# Patient Record
Sex: Female | Born: 1950 | Race: White | Hispanic: No | Marital: Married | Smoking: Never smoker
Health system: Southern US, Community
[De-identification: ages and names within clinical notes are randomized; demographics above are authoritative.]

## PROBLEM LIST (undated history)

## (undated) DIAGNOSIS — C50919 Malignant neoplasm of unspecified site of unspecified female breast: Secondary | ICD-10-CM

## (undated) DIAGNOSIS — F32A Depression, unspecified: Secondary | ICD-10-CM

## (undated) HISTORY — PX: MASTECTOMY: SHX3

---

## 2004-03-24 ENCOUNTER — Ambulatory Visit (HOSPITAL_COMMUNITY): Admission: RE | Admit: 2004-03-24 | Discharge: 2004-03-24 | Payer: Self-pay | Admitting: Gastroenterology

## 2004-03-24 ENCOUNTER — Encounter (INDEPENDENT_AMBULATORY_CARE_PROVIDER_SITE_OTHER): Payer: Self-pay | Admitting: Specialist

## 2009-08-03 ENCOUNTER — Encounter: Admission: RE | Admit: 2009-08-03 | Discharge: 2009-08-03 | Payer: Self-pay | Admitting: Orthopedic Surgery

## 2009-08-27 ENCOUNTER — Encounter: Admission: RE | Admit: 2009-08-27 | Discharge: 2009-08-27 | Payer: Self-pay | Admitting: Neurosurgery

## 2010-09-07 IMAGING — CT CT EXTREM UP W/O CM*R*
2 of 3 series · 8 of 14 positions shown, 9 images · non-contrast
Comparison: None

CLINICAL DATA: Humeral fracture.  History of right breast cancer.

CT OF THE RIGHT SHOULDER WITHOUT CONTRAST
TECHNIQUE: Multidetector CT imaging of the right shoulder was
performed according to the standard protocol without intravenous
contrast. Multiplanar CT image reconstructions were also generated.

[Series 3: shoulder/standard · axial · 0.27mm/px · z∈[-175,-50]mm · 6 of 142 slices shown]
[im 21/142  soft-tissue]
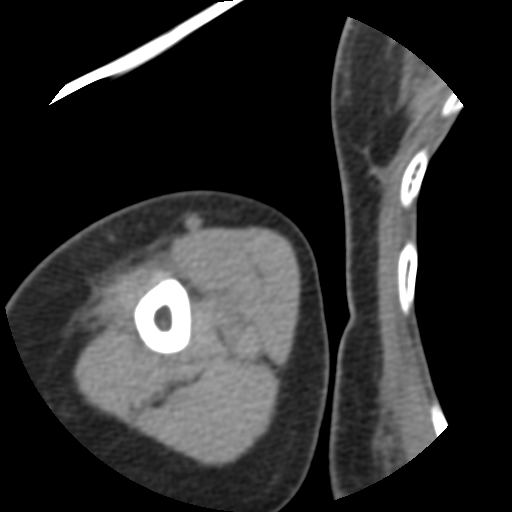
[im 41/142  soft-tissue]
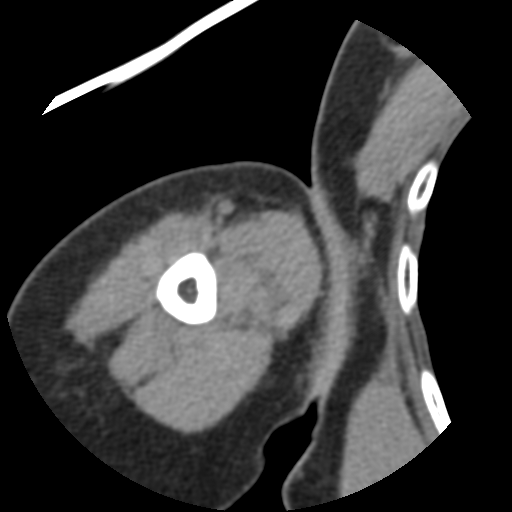
[im 61/142  soft-tissue]
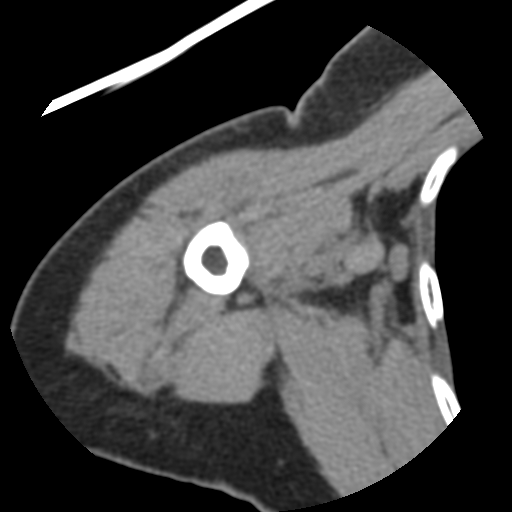
[im 81/142  soft-tissue]
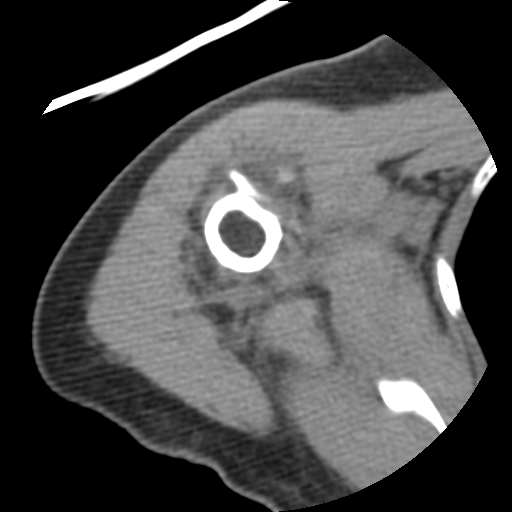
[im 101/142  soft-tissue]
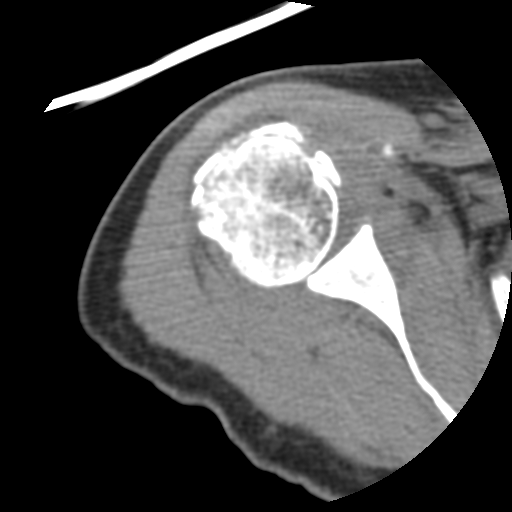
[im 121/142  soft-tissue]
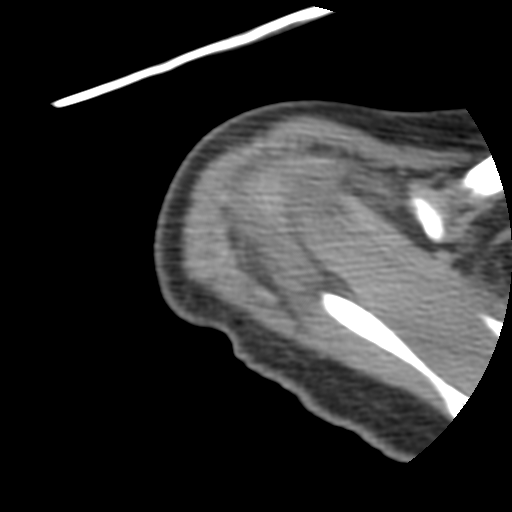

[Series 401: reformat · oblique · 0.35mm/px · 2 of 65 slices shown, 3 images]
[im 22/65  soft-tissue]
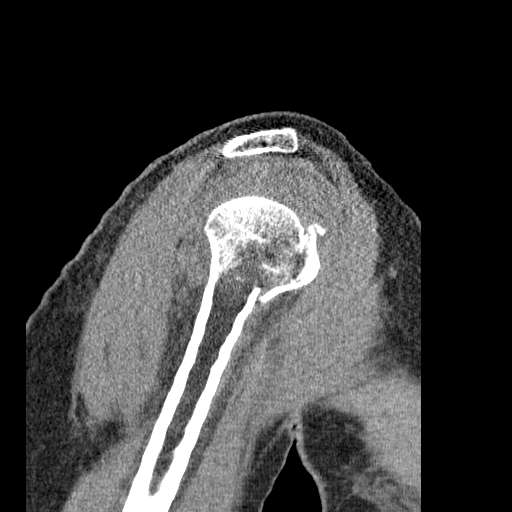
[im 22/65  bone]
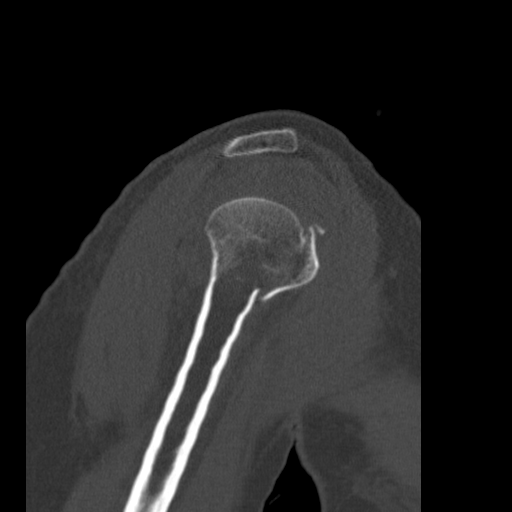
[im 43/65  bone]
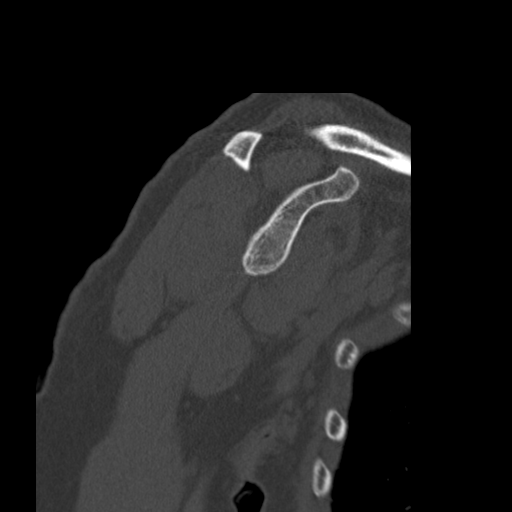

[8 of 14 positions shown; findings below may reference images not displayed]

FINDINGS: There is a mildly comminuted fracture of the surgical
neck of the right humerus.  This involves both tuberosities which
are mildly displaced.  The majority of the articular surface of the
humeral head is spared.  The main fracture fragments demonstrate
mild displacement.  There is no significant angulation.

There is moderate sized shoulder joint effusion.  The scapular
glenoid, acromion and coracoid process are intact.  The scapular
body is incompletely imaged.  On the lower axial images, there is
lucency in the scapular body (images 62-70) which could reflect a
nondisplaced fracture or vascular groove.  No rib fracture is
identified.

Normal-sized lymph nodes are present in the right axilla.  No lytic
or blastic osseous lesions are identified.
IMPRESSION: 1.  Comminuted and mildly displaced fracture of the humeral neck
involving both tuberosities.
2.  Possible nondisplaced fracture of the scapular body.  There is
no involvement of the glenoid or acromion.
3.  No evidence of osseous metastatic disease.

## 2011-05-06 NOTE — Op Note (Signed)
NAME:  Crystal Alvarado, Crystal Alvarado                        ACCOUNT NO.:  [id number]   MEDICAL RECORD NO.:  [id number]                   PATIENT TYPE:  AMB   LOCATION:  ENDO                                 FACILITY:  [facility]   PHYSICIAN:  Danise Edge, M.D.                DATE OF BIRTH:  04-14-1951   DATE OF PROCEDURE:  03/24/2004  DATE OF DISCHARGE:                                 OPERATIVE REPORT   PROCEDURE:  Colonoscopy and polypectomy.   REFERRED BY:  Windle Guard, M.D.   INDICATIONS FOR PROCEDURE:  Crystal Alvarado is a 60 year old female born  11-01-1951.  Crystal Alvarado is scheduled to undergo her first screening  colonoscopy with polypectomy.  She has guaiac positive stool.  Her  grandmother, two uncles, and her aunt were diagnosed with colon cancer.  Her  mother died at an early age and was not diagnosed with colon cancer.  Her  older brother has not undergone a screening colonoscopy.   ENDOSCOPIST:  Danise Edge, M.D.   PREMEDICATION:  Versed 10 mg, Demerol 50 mg.   DESCRIPTION OF PROCEDURE:  After obtaining informed consent, Crystal Alvarado was  placed in the left lateral decubitus position. I administered intravenous  Demerol and intravenous Versed to achieve conscious sedation for the  procedure. The patient's blood pressure, oxygen saturation and cardiac  rhythm were monitored throughout the procedure and documented in the medical  record.   Anal inspection and digital rectal examination were normal. The Olympus  adjustable pediatric video colonoscope was introduced into the rectum and  advanced to the cecum. Colonic preparation for the exam today was excellent.   RECTUM:  Normal.   SIGMOID COLON AND DESCENDING COLON:  From the distal sigmoid colon at 35 cm  from the anal verge, a 1 cm pedunculated polyp and a 2 mm sessile polyp were  removed with the electrocautery snare and submitted in one bottle for  pathological evaluation.   SPLENIC FLEXURE:  Normal.   TRANSVERSE COLON:  Normal.   HEPATIC FLEXURE:  Normal.   ASCENDING COLON:  Normal.   CECUM AND ILEOCECAL VALVE:  Normal.   ASSESSMENT:  From the distal sigmoid colon, a 1 cm pedunculated polyp and a  2 mm sessile polyp were removed.   RECOMMENDATIONS:  Repeat colonoscopy in 3-5 years.                                               Danise Edge, M.D.    MJ/MEDQ  D:  03/24/2004  T:  03/24/2004  Job:  [id number]   cc:   Windle Guard, M.D.  [address]  [place], [place] [address]  Fax: [phone]

## 2014-04-23 ENCOUNTER — Other Ambulatory Visit: Payer: Self-pay | Admitting: Family Medicine

## 2014-04-23 DIAGNOSIS — E559 Vitamin D deficiency, unspecified: Secondary | ICD-10-CM

## 2014-05-01 ENCOUNTER — Ambulatory Visit
Admission: RE | Admit: 2014-05-01 | Discharge: 2014-05-01 | Disposition: A | Discharge status: Home / Self Care | Payer: BC Managed Care – PPO | Source: Ambulatory Visit | Attending: Family Medicine | Admitting: Family Medicine

## 2014-05-01 DIAGNOSIS — E559 Vitamin D deficiency, unspecified: Secondary | ICD-10-CM

## 2020-01-30 ENCOUNTER — Ambulatory Visit: Payer: Self-pay

## 2024-09-08 ENCOUNTER — Encounter (HOSPITAL_COMMUNITY): Payer: Self-pay

## 2024-09-08 ENCOUNTER — Emergency Department (HOSPITAL_COMMUNITY)

## 2024-09-08 ENCOUNTER — Observation Stay (HOSPITAL_COMMUNITY)
Admission: EM | Admit: 2024-09-08 | Discharge: 2024-09-10 | Disposition: A | Discharge status: Home / Self Care | Attending: Internal Medicine | Admitting: Internal Medicine

## 2024-09-08 ENCOUNTER — Other Ambulatory Visit: Payer: Self-pay

## 2024-09-08 DIAGNOSIS — R42 Dizziness and giddiness: Principal | ICD-10-CM

## 2024-09-08 DIAGNOSIS — I517 Cardiomegaly: Secondary | ICD-10-CM | POA: Insufficient documentation

## 2024-09-08 DIAGNOSIS — I6389 Other cerebral infarction: Secondary | ICD-10-CM | POA: Diagnosis not present

## 2024-09-08 DIAGNOSIS — M810 Age-related osteoporosis without current pathological fracture: Secondary | ICD-10-CM | POA: Insufficient documentation

## 2024-09-08 DIAGNOSIS — F32A Depression, unspecified: Secondary | ICD-10-CM | POA: Diagnosis not present

## 2024-09-08 DIAGNOSIS — R112 Nausea with vomiting, unspecified: Secondary | ICD-10-CM | POA: Diagnosis present

## 2024-09-08 DIAGNOSIS — Z853 Personal history of malignant neoplasm of breast: Secondary | ICD-10-CM | POA: Diagnosis not present

## 2024-09-08 DIAGNOSIS — R2681 Unsteadiness on feet: Secondary | ICD-10-CM | POA: Diagnosis not present

## 2024-09-08 DIAGNOSIS — I2699 Other pulmonary embolism without acute cor pulmonale: Secondary | ICD-10-CM | POA: Diagnosis not present

## 2024-09-08 DIAGNOSIS — I63442 Cerebral infarction due to embolism of left cerebellar artery: Secondary | ICD-10-CM

## 2024-09-08 HISTORY — DX: Malignant neoplasm of unspecified site of unspecified female breast: C50.919

## 2024-09-08 HISTORY — DX: Depression, unspecified: F32.A

## 2024-09-08 LAB — CBC WITH DIFFERENTIAL/PLATELET
Abs Immature Granulocytes: 0.02 K/uL (ref 0.00–0.07)
Basophils Absolute: 0 K/uL (ref 0.0–0.1)
Basophils Relative: 1 %
Eosinophils Absolute: 0.1 K/uL (ref 0.0–0.5)
Eosinophils Relative: 1 %
HCT: 44.6 % (ref 36.0–46.0)
Hemoglobin: 14.4 g/dL (ref 12.0–15.0)
Immature Granulocytes: 0 %
Lymphocytes Relative: 23 %
Lymphs Abs: 1.4 K/uL (ref 0.7–4.0)
MCH: 30.3 pg (ref 26.0–34.0)
MCHC: 32.3 g/dL (ref 30.0–36.0)
MCV: 93.9 fL (ref 80.0–100.0)
Monocytes Absolute: 0.6 K/uL (ref 0.1–1.0)
Monocytes Relative: 10 %
Neutro Abs: 4 K/uL (ref 1.7–7.7)
Neutrophils Relative %: 65 %
Platelets: 236 K/uL (ref 150–400)
RBC: 4.75 MIL/uL (ref 3.87–5.11)
RDW: 12.7 % (ref 11.5–15.5)
WBC: 6.1 K/uL (ref 4.0–10.5)
nRBC: 0 % (ref 0.0–0.2)

## 2024-09-08 LAB — BASIC METABOLIC PANEL WITH GFR
Anion gap: 13 (ref 5–15)
BUN: 23 mg/dL (ref 8–23)
CO2: 21 mmol/L — ABNORMAL LOW (ref 22–32)
Calcium: 9 mg/dL (ref 8.9–10.3)
Chloride: 107 mmol/L (ref 98–111)
Creatinine, Ser: 0.69 mg/dL (ref 0.44–1.00)
GFR, Estimated: >60 mL/min (ref >60)
Glucose, Bld: 100 mg/dL — ABNORMAL HIGH (ref 70–99)
Potassium: 4.3 mmol/L (ref 3.5–5.1)
Sodium: 140 mmol/L (ref 135–145)

## 2024-09-08 LAB — APTT: aPTT: 26 s (ref 24–36)

## 2024-09-08 LAB — TROPONIN T, HIGH SENSITIVITY: Troponin T High Sensitivity: <15 ng/L (ref 0–19)

## 2024-09-08 MED ORDER — HEPARIN (PORCINE) 25000 UT/250ML-% IV SOLN
650.0000 [IU]/h | INTRAVENOUS | Status: DC
  Administered 2024-09-08: 800 [IU]/h via INTRAVENOUS
  Administered 2024-09-10: 650 [IU]/h via INTRAVENOUS
  Filled 2024-09-08 (×2): qty 250

## 2024-09-08 MED ORDER — IOHEXOL 350 MG/ML SOLN
75.0000 mL | Freq: Once | INTRAVENOUS | Status: AC | PRN
  Administered 2024-09-08: 75 mL via INTRAVENOUS

## 2024-09-08 MED ORDER — HEPARIN BOLUS VIA INFUSION
2500.0000 [IU] | Freq: Once | INTRAVENOUS | Status: AC
  Administered 2024-09-08: 2500 [IU] via INTRAVENOUS
  Filled 2024-09-08: qty 2500

## 2024-09-08 MED ORDER — MECLIZINE HCL 25 MG PO TABS
25.0000 mg | ORAL_TABLET | Freq: Once | ORAL | Status: AC
  Administered 2024-09-08: 25 mg via ORAL
  Filled 2024-09-08: qty 1
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
## 2024-09-08 NOTE — H&P (Signed)
  History and Physical    Patient: Crystal Alvarado FMW:[id number] DOB: 05/15/1951 DOA: 09/08/2024 DOS: the patient was seen and examined on 09/08/2024 PCP: Loring Tanda Mae, MD  Patient coming from: Home  Chief Complaint:  Chief Complaint  Patient presents with   Dizziness   HPI: Crystal Alvarado is a 73 y.o. female with a history of breast CA who presented to the ED for dizziness, unsteadiness on her feet and on the advice of medical professional due to concern for PE on recent work up.   She reports***   Review of Systems: As mentioned in the history of present illness. All other systems reviewed and are negative. Past Medical History:  Diagnosis Date   Breast cancer [facility])    Depression    Past Surgical History:  Procedure Laterality Date   CESAREAN SECTION     MASTECTOMY     Social History:  reports that she has never smoked. She has never used smokeless tobacco. No history on file for alcohol use and drug use.  No Known Allergies  History reviewed. No pertinent family history.  Prior to Admission medications   Not on File    Physical Exam: Vitals:   09/08/24 2014 09/08/24 2015  BP: (!) 166/92   Pulse: 85   Resp: 18   Temp: 98.3 F (36.8 C)   TempSrc: Oral   SpO2: 100%   Weight:  51.7 kg  Height:  4' 11 (1.499 m)  Gen: *** Pulm: ***  CV: *** GI: Soft, NT, ND, +BS *** Neuro: Alert and oriented***. No new focal deficits. Ext: Warm, no deformities*** Skin: ***. ***o rashes, lesions or ulcers on visualized skin   Data Reviewed: {Results:26384}  Assessment and Plan: Acute/subacute segmental PE: Provoked by recent travel, also +FH w/mother. No RV strain by CTA. With her age, sex, breast CA history, falls into intermediate PESI (score 102).  - Will continue heparin  IV per pharmacy and likely convert to DOAC in AM.  - Echocardiogram - LE venous U/S***  Vertigo/dizziness:  - MRI brain in AM*** - Continue telemetry until central cause/CVA ruled out.   - For tonight will allow for permissive HTN.  - Vestibular PT in AM***    Advance Care Planning: Full code  Consults: None  Family Communication: ***  Severity of Illness: The appropriate patient status for this patient is OBSERVATION. Observation status is judged to be reasonable and necessary in order to provide the required intensity of service to ensure the patient's safety. The patient's presenting symptoms, physical exam findings, and initial radiographic and laboratory data in the context of their medical condition is felt to place them at decreased risk for further clinical deterioration. Furthermore, it is anticipated that the patient will be medically stable for discharge from the hospital within 2 midnights of admission.   Author: Bernardino KATHEE Come, MD 09/08/2024 11:14 PM  For on call review [email or web address].

## 2024-09-08 NOTE — ED Triage Notes (Signed)
 Pt presents via POV c/o dizziness. Reports was on a cruise and was sent to the ED for the same and was ruled out for brain bleed and stroke per pt report.   Pt report was called today and told to come to ED due to blood clots in top of lungs.   Denies chest pain and SOB.

## 2024-09-08 NOTE — ED Notes (Signed)
 Patient transported to CT

## 2024-09-08 NOTE — Progress Notes (Signed)
 PHARMACY - ANTICOAGULATION CONSULT NOTE  Pharmacy Consult for heparin  Indication: pulmonary embolus  No Known Allergies  Patient Measurements: Height: 4' 11 (149.9 cm) Weight: 51.7 kg (114 lb) IBW/kg (Calculated) : 43.2 HEPARIN  DW (KG): 51.7  Vital Signs: Temp: 98.3 F (36.8 C) (09/21 2014) Temp Source: Oral (09/21 2014) BP: 166/92 (09/21 2014) Pulse Rate: 85 (09/21 2014)  Labs: Recent Labs    09/08/24 2104  HGB 14.4  HCT 44.6  PLT 236  CREATININE 0.69    Estimated Creatinine Clearance: 43.4 mL/min (by C-G formula based on SCr of 0.69 mg/dL).   Medical History: Past Medical History:  Diagnosis Date   Breast cancer [facility])    Depression      Assessment: 73 yo female with PE, pharmacy to dose heparin , no prior AC noted  CBC WNL, Scr 0.69  Goal of Therapy:  Heparin  level 0.3-0.7 units/ml Monitor platelets by anticoagulation protocol: Yes   Plan:  Heparin  bolus 2500 units x 1 Start heparin  drip at 800 units/hr Heparin  level in 8 hours Daily CBC   Leeroy Mace RPh 09/08/2024, 10:49 PM

## 2024-09-09 ENCOUNTER — Observation Stay (HOSPITAL_BASED_OUTPATIENT_CLINIC_OR_DEPARTMENT_OTHER)

## 2024-09-09 ENCOUNTER — Encounter (HOSPITAL_COMMUNITY): Payer: Self-pay | Admitting: Family Medicine

## 2024-09-09 ENCOUNTER — Observation Stay (HOSPITAL_COMMUNITY)

## 2024-09-09 DIAGNOSIS — Z86711 Personal history of pulmonary embolism: Secondary | ICD-10-CM | POA: Diagnosis not present

## 2024-09-09 DIAGNOSIS — I63442 Cerebral infarction due to embolism of left cerebellar artery: Secondary | ICD-10-CM

## 2024-09-09 DIAGNOSIS — I639 Cerebral infarction, unspecified: Secondary | ICD-10-CM

## 2024-09-09 DIAGNOSIS — I2699 Other pulmonary embolism without acute cor pulmonale: Secondary | ICD-10-CM | POA: Diagnosis not present

## 2024-09-09 DIAGNOSIS — R42 Dizziness and giddiness: Principal | ICD-10-CM

## 2024-09-09 DIAGNOSIS — R29701 NIHSS score 1: Secondary | ICD-10-CM

## 2024-09-09 DIAGNOSIS — Z853 Personal history of malignant neoplasm of breast: Secondary | ICD-10-CM

## 2024-09-09 DIAGNOSIS — I2609 Other pulmonary embolism with acute cor pulmonale: Secondary | ICD-10-CM

## 2024-09-09 LAB — BASIC METABOLIC PANEL WITH GFR
Anion gap: 12 (ref 5–15)
BUN: 19 mg/dL (ref 8–23)
CO2: 23 mmol/L (ref 22–32)
Calcium: 8.3 mg/dL — ABNORMAL LOW (ref 8.9–10.3)
Chloride: 106 mmol/L (ref 98–111)
Creatinine, Ser: 0.64 mg/dL (ref 0.44–1.00)
GFR, Estimated: >60 mL/min (ref >60)
Glucose, Bld: 95 mg/dL (ref 70–99)
Potassium: 4 mmol/L (ref 3.5–5.1)
Sodium: 140 mmol/L (ref 135–145)

## 2024-09-09 LAB — ECHOCARDIOGRAM COMPLETE
Area-P 1/2: 3.6 cm2
Height: 59 in
S' Lateral: 2.6 cm
Single Plane A2C EF: 51 %
Weight: 1824 [oz_av]

## 2024-09-09 LAB — LIPID PANEL
Cholesterol: 272 mg/dL — ABNORMAL HIGH (ref 0–200)
HDL: 53 mg/dL (ref >40)
LDL Cholesterol: 197 mg/dL — ABNORMAL HIGH (ref 0–99)
Total CHOL/HDL Ratio: 5.1 ratio
Triglycerides: 109 mg/dL (ref <150)
VLDL: 22 mg/dL (ref 0–40)

## 2024-09-09 LAB — CBC
HCT: 41.7 % (ref 36.0–46.0)
Hemoglobin: 13.8 g/dL (ref 12.0–15.0)
MCH: 31.4 pg (ref 26.0–34.0)
MCHC: 33.1 g/dL (ref 30.0–36.0)
MCV: 94.8 fL (ref 80.0–100.0)
Platelets: 218 K/uL (ref 150–400)
RBC: 4.4 MIL/uL (ref 3.87–5.11)
RDW: 13 % (ref 11.5–15.5)
WBC: 5.9 K/uL (ref 4.0–10.5)
nRBC: 0 % (ref 0.0–0.2)

## 2024-09-09 LAB — HEMOGLOBIN A1C
Hgb A1c MFr Bld: 5 % (ref 4.8–5.6)
Mean Plasma Glucose: 96.8 mg/dL

## 2024-09-09 LAB — HEPARIN LEVEL (UNFRACTIONATED)
Heparin Unfractionated: 0.78 [IU]/mL — ABNORMAL HIGH (ref 0.30–0.70)
Heparin Unfractionated: 0.4 [IU]/mL (ref 0.30–0.70)

## 2024-09-09 MED ORDER — ONDANSETRON HCL 4 MG PO TABS: 4.0000 mg | ORAL_TABLET | Freq: Four times a day (QID) | ORAL | Status: DC | PRN

## 2024-09-09 MED ORDER — SODIUM CHLORIDE 0.9% FLUSH
3.0000 mL | Freq: Two times a day (BID) | INTRAVENOUS | Status: DC
Start: 2024-09-09 — End: 2024-09-10
  Administered 2024-09-09 – 2024-09-10 (×2): 3 mL via INTRAVENOUS

## 2024-09-09 MED ORDER — MECLIZINE HCL 25 MG PO TABS: 25.0000 mg | ORAL_TABLET | Freq: Three times a day (TID) | ORAL | Status: DC | PRN

## 2024-09-09 MED ORDER — IOHEXOL 350 MG/ML SOLN
75.0000 mL | Freq: Once | INTRAVENOUS | Status: AC | PRN
  Administered 2024-09-09: 75 mL via INTRAVENOUS

## 2024-09-09 MED ORDER — ATORVASTATIN CALCIUM 40 MG PO TABS
40.0000 mg | ORAL_TABLET | Freq: Every day | ORAL | Status: DC
  Administered 2024-09-09 – 2024-09-10 (×2): 40 mg via ORAL
  Filled 2024-09-09 (×2): qty 1

## 2024-09-09 MED ORDER — ONDANSETRON HCL 4 MG/2ML IJ SOLN: 4.0000 mg | Freq: Four times a day (QID) | INTRAMUSCULAR | Status: DC | PRN

## 2024-09-09 MED ORDER — ESCITALOPRAM OXALATE 10 MG PO TABS
10.0000 mg | ORAL_TABLET | Freq: Every day | ORAL | Status: DC
  Administered 2024-09-09 – 2024-09-10 (×2): 10 mg via ORAL
  Filled 2024-09-09 (×2): qty 1

## 2024-09-09 MED ORDER — STROKE: EARLY STAGES OF RECOVERY BOOK
Freq: Once | Status: AC
  Filled 2024-09-09: qty 1

## 2024-09-09 MED ORDER — ACETAMINOPHEN 650 MG RE SUPP: 650.0000 mg | Freq: Four times a day (QID) | RECTAL | Status: DC | PRN

## 2024-09-09 MED ORDER — ACETAMINOPHEN 325 MG PO TABS: 650.0000 mg | ORAL_TABLET | Freq: Four times a day (QID) | ORAL | Status: DC | PRN

## 2024-09-09 NOTE — Progress Notes (Signed)
 SLP Cancellation Note  Patient Details Name: Crystal Alvarado MRN: [id number] DOB: 1951/04/24   Cancelled treatment:       Reason Eval/Treat Not Completed: SLP screened, no needs identified, will sign off  Norleen IVAR Blase, MA, CCC-SLP Speech Therapy

## 2024-09-09 NOTE — Progress Notes (Signed)
 PHARMACY - ANTICOAGULATION CONSULT NOTE  Pharmacy Consult for heparin  Indication: pulmonary embolus  No Known Allergies  Patient Measurements: Height: 4' 11 (149.9 cm) Weight: 51.7 kg (114 lb) IBW/kg (Calculated) : 43.2 HEPARIN  DW (KG): 51.7  Vital Signs: Temp: 98.4 F (36.9 C) (09/22 0812) Temp Source: Oral (09/22 0812) BP: 123/79 (09/22 0812) Pulse Rate: 66 (09/22 0812)  Labs: Recent Labs    09/08/24 2104 09/08/24 2305 09/09/24 0705  HGB 14.4  --  13.8  HCT 44.6  --  41.7  PLT 236  --  218  APTT  --  26  --   HEPARINUNFRC  --   --  0.78*  CREATININE 0.69  --  0.64    Estimated Creatinine Clearance: 43.4 mL/min (by C-G formula based on SCr of 0.64 mg/dL).   Medical History: Past Medical History:  Diagnosis Date   Breast cancer [facility])    Depression      Assessment: 73 yo female with history of breast cancer presented to ED for dizziness and per advice of medical professional for workup for PE. Patient reports recent travel (flight + cruise). During cruise, she was evaluated in the ED during stop in [place] where she had a CT head negative for stroke/bleeding per patient. She continued to have symptoms of vertigo. She had messages noting concern for blood clots in the top of her lungs and advised to go immediately to the ED.  Imaging here positive for segmental right middle and lower lobe PE, possibly acute, although subacute/chronic emboli could have this appearance. No evidence of right heart strain.  Patient was started on heparin  infusion with pharmacy consulted for dosing.  Today: -Heparin  level 0.78 - supratherapeutic with heparin  infusing at 800 units/hr -Heparin  infusion briefly paused (after level drawn) for MRI -CBC stable -No complications of therapy noted  Goal of Therapy:  Heparin  level 0.3-0.7 units/ml Monitor platelets by anticoagulation protocol: Yes   Plan:  -Decrease heparin  infusion to 650 units/hr -Recheck heparin  level ~ 8  hours -Daily CBC -Continue to follow for plan of care   Stefano MARLA Bologna, PharmD, BCPS Clinical Pharmacist 09/09/2024 9:13 AM

## 2024-09-09 NOTE — TOC Initial Note (Signed)
 Transition of Care [facility]) - Initial/Assessment Note    Patient Details  Name: Crystal Alvarado MRN: [id number] Date of Birth: 1951/02/12  Transition of Care [facility] CM/SW Contact:    Bascom Service, RN Phone Number: 09/09/2024, 3:13 PM  Clinical Narrative:   d/c plan home. Has own transport home.                Expected Discharge Plan: Home/Self Care Barriers to Discharge: Continued Medical Work up   Patient Goals and CMS Choice Patient states their goals for this hospitalization and ongoing recovery are:: Home CMS Medicare.gov Compare Post Acute Care list provided to:: Patient Choice offered to / list presented to : Patient [place] ownership interest in [facility].provided to:: Patient    Expected Discharge Plan and Services   Discharge Planning Services: CM Consult   Living arrangements for the past 2 months: Single Family Home                                      Prior Living Arrangements/Services Living arrangements for the past 2 months: Single Family Home Lives with:: Spouse   Do you feel safe going back to the place where you live?: Yes               Activities of Daily Living   ADL Screening (condition at time of admission) Independently performs ADLs?: Yes (appropriate for developmental age) Is the patient deaf or have difficulty hearing?: No Does the patient have difficulty seeing, even when wearing glasses/contacts?: No Does the patient have difficulty concentrating, remembering, or making decisions?: No  Permission Sought/Granted Permission sought to share information with : Case Manager                Emotional Assessment              Admission diagnosis:  Vertigo [R42] Other pulmonary embolism without acute cor pulmonale, unspecified chronicity (HCC) [I26.99] Acute pulmonary embolism without acute cor pulmonale (HCC) [I26.99] Patient Active Problem List   Diagnosis Date Noted   Vertigo 09/09/2024   History of  breast cancer 09/09/2024   Acute pulmonary embolism without acute cor pulmonale (HCC) 09/08/2024   PCP:  Loring Tanda Mae, MD Pharmacy:   [facility] DELIV[facility] - [place], [place] - [address] [place] [address] Phone: [phone] Fax: [phone]     Social Drivers of Health (SDOH) Social History: SDOH Screenings   Food Insecurity: No Food Insecurity (09/09/2024)  Housing: Low Risk  (09/09/2024)  Transportation Needs: No Transportation Needs (09/09/2024)  Utilities: Not At Risk (09/09/2024)  Social Connections: Patient Declined (09/09/2024)  Tobacco Use: Low Risk  (09/09/2024)   SDOH Interventions:     Readmission Risk Interventions     No data to display

## 2024-09-09 NOTE — Progress Notes (Signed)
 Per Lynwood Kipper, NP- it is okay to remove telemetry and pause heparin  drip for MRI

## 2024-09-09 NOTE — Care Management Obs Status (Signed)
 MEDICARE OBSERVATION STATUS NOTIFICATION   Patient Details  Name: Crystal Alvarado MRN: [id number] Date of Birth: May 18, 1951   Medicare Observation Status Notification Given:  Yes    MahabirNathanel, RN 09/09/2024, 3:12 PM

## 2024-09-09 NOTE — Progress Notes (Signed)
 PT Cancellation Note  Patient Details Name: Crystal Alvarado MRN: [id number] DOB: [birth year]   Cancelled Treatment:    Reason Eval/Treat Not Completed: Patient at procedure or test/unavailable  Noting order for vestibular ; however, MRI revealing PICA infarct, likely cause of dizziness.  Attempted to see pt this am but U/S present to get doppler and transport arrived to take for CT.  Plan to f/u in afternoon as able.   Benjiman, PT Acute Rehab [facility] [phone]   Benjiman VEAR Mulberry 09/09/2024, 11:42 AM
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
## 2024-09-09 NOTE — Consult Note (Signed)
 NEUROLOGY CONSULT NOTE   Date of service: September 09, 2024 Patient Name: JORDEN MAHL MRN:  [id number] DOB:  31-Dec-1950 Chief Complaint: dizziness Requesting Provider: Raenelle Coria, MD  History of Present Illness  RASHAUNDA RAHL is a 73 y.o. female with hx of breast cancer who presented to [facility] 9/21 d/t dizziness and unsteadiness and concern for PE on recent workup.   She reports flying to [place] and going on a cruise up the [place] se[place] last week. The flight was < 1 hour and she still walked > 10k steps every day of the trip. Once on the ship she had an episode where she bent over to put clothes in a a drawer and on standing up again she experienced immediate, severe room-spinning dizziness with vomiting and persistent nausea. When symptoms did not improve over the next 1-2 days, they stopped in [place] where she was evaluated at the ED with a head CT that was read as negative for stroke/bleed per patient. Her vertigo symptoms continued, though slightly improving, and she remains unsteady on her feet compared to baseline due to this. She and her husband drove back home and when they got home had messages from the EDP in [place] who stated the radiologist noticed findings suggestive of blood clots in the top of her lungs, advised her to to go ED at once, so she presented tonight.  denies any dyspnea, chest pain, cough, hemoptysis, personal history of blood clots (her mother had them recurrently). No leg swelling or pain. Work up here confirmed pulmonary emboli for which heparin  was started.   Patient examined while US  in room performing DVT study. Husband and daughter at bedside. She denies headache, SOB, dizziness at rest. Just worked with PT/OT and walked the hallway and states that she only felt slight dizziness, much improved. No drifts on exam, but did not slight sensory difference to RLE.   CT Angio was just completed, pending read. Heparin  IV running. Pending ECHO.      NIHSS components Score: Comment  1a Level of Conscious 0[]  1[]  2[]  3[]      1b LOC Questions 0[]  1[]  2[]       1c LOC Commands 0[]  1[]  2[]       2 Best Gaze 0[]  1[]  2[]       3 Visual 0[]  1[]  2[]  3[]      4 Facial Palsy 0[]  1[]  2[]  3[]      5a Motor Arm - left 0[]  1[]  2[]  3[]  4[]  UN[]    5b Motor Arm - Right 0[]  1[]  2[]  3[]  4[]  UN[]    6a Motor Leg - Left 0[]  1[]  2[]  3[]  4[]  UN[]    6b Motor Leg - Right 0[]  1[]  2[]  3[]  4[]  UN[]    7 Limb Ataxia 0[]  1[]  2[]  UN[]      8 Sensory 0[]  1[x]  2[]  UN[]      9 Best Language 0[]  1[]  2[]  3[]      10 Dysarthria 0[]  1[]  2[]  UN[]      11 Extinct. and Inattention 0[]  1[]  2[]       TOTAL:       ROS  Comprehensive ROS performed and pertinent positives documented in HPI   Past History   Past Medical History:  Diagnosis Date   Breast cancer (HCC)    Depression     Past Surgical History:  Procedure Laterality Date   CESAREAN SECTION     MASTECTOMY      Family History: History reviewed. No pertinent family history.  Social History  reports that she  has never smoked. She has never used smokeless tobacco. No history on file for alcohol use and drug use.  No Known Allergies  Medications   Current Facility-Administered Medications:    [START ON 09/10/2024]  stroke: early stages of recovery book, , Does not apply, Once, Raenelle Coria, MD   acetaminophen  (TYLENOL ) tablet 650 mg, 650 mg, Oral, Q6H PRN **OR** acetaminophen  (TYLENOL ) suppository 650 mg, 650 mg, Rectal, Q6H PRN, Bryn Bernardino NOVAK, MD   escitalopram  (LEXAPRO ) tablet 10 mg, 10 mg, Oral, Daily, Bryn Bernardino B, MD, 10 mg at 09/09/24 1010   heparin  ADULT infusion 100 units/mL (25000 units/250mL), 650 Units/hr, Intravenous, Continuous, Ellington, Abby K, RPH, Last Rate: 6.5 mL/hr at 09/09/24 0809, 650 Units/hr at 09/09/24 0809   meclizine  (ANTIVERT ) tablet 25 mg, 25 mg, Oral, TID PRN, Bryn Bernardino NOVAK, MD   ondansetron  (ZOFRAN ) tablet 4 mg, 4 mg, Oral, Q6H PRN **OR** ondansetron  (ZOFRAN ) injection 4  mg, 4 mg, Intravenous, Q6H PRN, Bryn Bernardino NOVAK, MD   sodium chloride  flush (NS) 0.9 % injection 3 mL, 3 mL, Intravenous, Q12H, Bryn Bernardino B, MD, 3 mL at 09/09/24 1010  Vitals   Vitals:   September 16, 2024 2015 09/09/24 0016 09/09/24 0440 09/09/24 0812  BP:  (!) 155/86 114/62 123/79  Pulse:   85 66  Resp:   16 20  Temp:  99 F (37.2 C) 98.4 F (36.9 C) 98.4 F (36.9 C)  TempSrc:  Oral Oral Oral  SpO2:  95% 97% 99%  Weight: 51.7 kg     Height: 4' 11 (1.499 m)       Body mass index is 23.03 kg/m.   Physical Exam   Constitutional: Appears well-developed and well-nourished.  Cardiovascular: Normal rate and regular rhythm.  Respiratory: Effort normal, non-labored breathing.   Neurologic Examination   Neuro: Mental Status: Patient is awake, alert, oriented to person, place, month, year, and situation. Patient is able to give a clear and coherent history. No signs of aphasia or neglect Cranial Nerves: II: Visual Fields are full. Pupils are equal, round, and reactive to light.   III,IV, VI: EOMI without ptosis or diploplia. Fully tracks examiner.  V: Facial sensation is symmetric to light touch VII: Facial movement is symmetric.  VIII: hearing is intact to voice X: Uvula elevates symmetrically. No dysarthria.  XI: Head turn is symmetric. XII: tongue is midline Motor: Tone is normal. Bulk is normal.  Spontaneously moving all extremities, no weakness noted with PT/OT RLE being scanned for DVT during exam.  Sensory: Sensation is noted as decreased in RLE.  Cerebellar: No overt ataxia noted   Labs/Imaging/Neurodiagnostic studies   CBC:  Recent Labs  Lab September 16, 2024 2104 09/09/24 0705  WBC 6.1 5.9  NEUTROABS 4.0  --   HGB 14.4 13.8  HCT 44.6 41.7  MCV 93.9 94.8  PLT 236 218   Basic Metabolic Panel:  Lab Results  Component Value Date   NA 140 09/09/2024   K 4.0 09/09/2024   CO2 23 09/09/2024   GLUCOSE 95 09/09/2024   BUN 19 09/09/2024   CREATININE 0.64 09/09/2024    CALCIUM  8.3 (L) 09/09/2024   GFRNONAA >60 09/09/2024   Lipid Panel: No results found for: LDLCALC HgbA1c: No results found for: HGBA1C Urine Drug Screen: No results found for: LABOPIA, COCAINSCRNUR, LABBENZ, AMPHETMU, THCU, LABBARB  Alcohol Level No results found for: [facility] INR No results found for: INR APTT  Lab Results  Component Value Date   APTT 26 2024-09-16   AED levels:  No results found for: PHENYTOIN, ZONISAMIDE, LAMOTRIGINE, LEVETIRACETA  CT angio Head and Neck with contrast(Personally reviewed): PENDING  MRI Brain(Personally reviewed): 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 But in the conjunction of recent PE this raises the possibility of paradoxical embolism  ASSESSMENT   73 y.o. female with a history of breast CA who presented to the ED for dizziness, unsteadiness on her feet and on the advice of medical professional due to concern for PE on recent work up. MRI brain, 5 mm acute to subacute infarct left PICA territory.   CT Angio and ECHO pending  RECOMMENDATIONS   - Frequent Neuro checks per stroke unit protocol - TTE - lipitor 40mg  daily - A1C - Antithrombotic - Heparin  IV - DVT ppx - Heparin  IV - Smoking cessation - will counsel patient - Telemetry monitoring for arrhythmia - 72h - Swallow screen - will be performed prior to PO intake - Stroke education - will be given - PT/OT/SLP  ___________________________________________________________________  Bonney Rocky JAYSON Judithe, NP [facility]   I have seen the patient reviewed the above note.  She has a small, embolic appearing stroke.  In the setting of pulmonary embolus, paradoxical embolus is a very likely culprit.  She is going to be anticoagulated, so I do not feel there is any urgency, but could consider assessing for shunt with TCD with bubble as an  outpatient. With the fact that we are already a week out and her stroke is very samll, I think the risk of hemorrhagic conversion is small.  With embolic strokes at her age, I also do consider atrial fibrillation, but with another more likely etiology, I am not sure I would proceed with implanted loop recorder.  I would favor starting with prolonged cardiac monitoring with a Zio patch and outpatient follow-up for consideration of other assessment for PFO. Her LDL is quite high and will start intensive statin therapy with atorvastatin .   If she stops anticoagulation in the future, would start ASA 81mg  daily.    Aisha Seals, MD [facility]   If 7pm- 7am, please page neurology on call as listed in AMION.

## 2024-09-09 NOTE — Progress Notes (Signed)
 VASCULAR LAB    Bilateral lower extremity venous duplex has been performed.  See CV proc for preliminary results.   Taunia Frasco, RVT 09/09/2024, 12:58 PM

## 2024-09-09 NOTE — Progress Notes (Signed)
  Echocardiogram 2D Echocardiogram has been performed.  Crystal Alvarado, RDCS 09/09/2024, 2:40 PM

## 2024-09-09 NOTE — Progress Notes (Signed)
 PHARMACY - ANTICOAGULATION CONSULT NOTE  Pharmacy Consult for heparin  Indication: pulmonary embolus  No Known Allergies  Patient Measurements: Height: 4' 11 (149.9 cm) Weight: 51.7 kg (114 lb) IBW/kg (Calculated) : 43.2 HEPARIN  DW (KG): 51.7  Vital Signs: Temp: 98.4 F (36.9 C) (09/22 1537) Temp Source: Oral (09/22 1537) BP: 109/63 (09/22 1537) Pulse Rate: 78 (09/22 1537)  Labs: Recent Labs    09/08/24 2104 09/08/24 2305 09/09/24 0705 09/09/24 1633  HGB 14.4  --  13.8  --   HCT 44.6  --  41.7  --   PLT 236  --  218  --   APTT  --  26  --   --   HEPARINUNFRC  --   --  0.78* 0.40  CREATININE 0.69  --  0.64  --     Estimated Creatinine Clearance: 43.4 mL/min (by C-G formula based on SCr of 0.64 mg/dL).   Medical History: Past Medical History:  Diagnosis Date   Breast cancer [facility])    Depression      Assessment: 73 yo female with history of breast cancer presented to ED for dizziness and per advice of medical professional for workup for PE. Patient reports recent travel (flight + cruise). During cruise, she was evaluated in the ED during stop in [place] where she had a CT head negative for stroke/bleeding per patient. She continued to have symptoms of vertigo. She had messages noting concern for blood clots in the top of her lungs and advised to go immediately to the ED.  Imaging here positive for segmental right middle and lower lobe PE, possibly acute, although subacute/chronic emboli could have this appearance. No evidence of right heart strain.  Patient was started on heparin  infusion with pharmacy consulted for dosing.  Today: -Heparin  level now therapeutic after rate decreased to 650 units/hr -CBC stable -No complications of therapy noted  Goal of Therapy:  Heparin  level 0.3-0.7 units/ml Monitor platelets by anticoagulation protocol: Yes   Plan:  -Continue heparin  infusion to 650 units/hr -Recheck heparin  level ~ 8 hours -Daily CBC -Continue to  follow for plan of care   Kacia Halley, BARD LABOR, PharmD, BCPS Clinical Pharmacist 09/09/2024 5:47 PM

## 2024-09-09 NOTE — Evaluation (Signed)
 Physical Therapy Evaluation Patient Details Name: Crystal Alvarado MRN: [id number] DOB: 28-May-1951 Today's Date: 09/09/2024  History of Present Illness  Pt is a 73 y/o female presenting on 09/08/24 with dizziness, imbalance and per the advice of a previously visited out of town ED. Symptoms began while pt was on a cruise, sought care at ED in [place] with CT head clear. Pt found to have a PE, information relayed once on shore in [place]. MRI brain at [facility] ED showed small acute to subacute infarct in L PICA territory. Negative for DVT. Echo pending. PMH: hx of breast CA, depression   Clinical Impression  Pt admitted with above diagnosis.  Pt's vertigo symptoms have significantly improved since onset.  She was able to mobilize at supervision (for lines only) level and ambulated 500'.  Pt scoring 20/24 on DGI and participating in other balance activities - indicating low fall risk.  She had no DOE with activity and sats were 98% on RA. She reports feeling better but just still feels a little off and less confident with her balance.  Discussed options for further therapy and provided pt with balance HEP with instructions for progression.  Also, discussed likely to progress on her own but if not could consider outpt PT in future with referral from PCP - pt agrees with and prefers this plan. No further acute PT indicated.       If plan is discharge home, recommend the following:     Can travel by private vehicle        Equipment Recommendations None recommended by PT  Recommendations for Other Services       Functional Status Assessment Patient has had a recent decline in their functional status and demonstrates the ability to make significant improvements in function in a reasonable and predictable amount of time.     Precautions / Restrictions Precautions Precautions: None      Mobility  Bed Mobility Overal bed mobility: Independent                  Transfers Overall  transfer level: Independent Equipment used: None                    Ambulation/Gait Ambulation/Gait assistance: Supervision Gait Distance (Feet): 500 Feet Assistive device: None Gait Pattern/deviations: WFL(Within Functional Limits) Gait velocity: normal     General Gait Details: supervision for safety; did drift R/L with challenges  Stairs Stairs: Yes Stairs assistance: Contact guard assist Stair Management: One rail Right, Alternating pattern, Forwards Number of Stairs: 4 General stair comments: limited stair # due to IV  Wheelchair Mobility     Tilt Bed    Modified Rankin (Stroke Patients Only) Modified Rankin (Stroke Patients Only) Pre-Morbid Rankin Score: No symptoms Modified Rankin: No significant disability     Balance Overall balance assessment: Modified Independent   Sitting balance-Leahy Scale: Normal       Standing balance-Leahy Scale: Good   Single Leg Stance - Right Leg: 15 Single Leg Stance - Left Leg: 8 Pt reports tandem and SLS would be difficult at baseline Tandem Stance - Right Leg: 10 Tandem Stance - Left Leg: 8 Rhomberg - Eyes Opened: 30 Rhomberg - Eyes Closed: 30 High level balance activites: Side stepping, Backward walking, Direction changes, Turns, Head turns, Sudden stops High Level Balance Comments: Performed above without LOB; did drift R/L with head nods.  Pt additionally able to turn in circle and pick item from floor without LOB Standardized Balance Assessment  Standardized Balance Assessment : Dynamic Gait Index   Dynamic Gait Index Level Surface: Normal Change in Gait Speed: Normal Gait with Horizontal Head Turns: Mild Impairment Gait with Vertical Head Turns: Mild Impairment Gait and Pivot Turn: Normal Step Over Obstacle: Mild Impairment Step Around Obstacles: Normal Steps: Mild Impairment Total Score: 20       Pertinent Vitals/Pain Pain Assessment Pain Assessment: No/denies pain    Home Living  Family/patient expects to be discharged to:: Private residence Living Arrangements: Spouse/significant other Available Help at Discharge: Family;Available 24 hours/day Type of Home: House Home Access: Stairs to enter   [employer] of Steps: 1   Home Layout: One level Home Equipment: Toilet riser;Shower seat - built in;Hand held shower head      Prior Function Prior Level of Function : Independent/Modified Independent;Driving             Mobility Comments: no AD needed ADLs Comments: Independent with ADLs, IADLs, driving. Enjoys crafts, making cards via stamping and spending time with grandchildren     Extremity/Trunk Assessment   Upper Extremity Assessment Upper Extremity Assessment: Overall WFL for tasks assessed    Lower Extremity Assessment Lower Extremity Assessment: Overall WFL for tasks assessed    Cervical / Trunk Assessment Cervical / Trunk Assessment: Normal  Communication        Cognition Arousal: Alert Behavior During Therapy: WFL for tasks assessed/performed   PT - Cognitive impairments: No apparent impairments                       PT - Cognition Comments: pleasant, motivated, asking appropriate questions Following commands: Intact       Cueing       General Comments General comments (skin integrity, edema, etc.): Initial eval for Vestibular ; however, pt later found to have subacute PICA infarct as cause of dizziness.  Pt reports first symptoms ~1 week ago.  States they were bad for a couple days and then have gradually been getting better.  Initially had hearing loss and double vision - both have resolved and vertigo significantly improved.  She reports just feels off when up and moving and not confident.  Pt tolerated mobility well.  Did educate on focus points and segmental turns if needed (pt did not at this time).  Discussed options for f/u PT care and that pt progressing well.  Provided with below HEP and educated that  could try on her own and then if not progressing could consider outpt PT with referral from PCP.  Pt agrees with this plan.  Pt did ask about driving and return to normal daily activity- discussed no obvious deficits from PT perspective but would need clearance from MD.   Access Code: [email or web address] URL: [email or web address] Date: 09/09/2024 Prepared by: Jacques Willingham  Program Notes These are just a few examples of some balance activities.  Start with them in a corner and have a chair nearby for arm support if needed.  To make them harder you can add a pillow or foam pad under your feet or close your eyes.  Other activities that you can try are walking on unlevel surfaces (grass, sand, gravel) and head rotations or nods with walking.  Have supervision with these initially.   HEP Provided Below:  These are just a few examples of some balance activities.  Start with them in a corner and have a chair nearby for arm support if needed.  To make them harder you can add a pillow  or foam pad under your feet or close your eyes.    Other activities that you can try are walking on unlevel surfaces (grass, sand, gravel) and head rotations or nods with walking.  Have supervision with these initially.   Exercises - Standing Balance in Corner with Eyes Closed  - 1 x daily - 7 x weekly - 1 sets - 3 reps - 30 sec hold - Romberg Stance with Head Nods  - 1 x daily - 7 x weekly - 2 sets - 10 reps - Romberg Stance with Head Rotation  - 1 x daily - 7 x weekly - 2 sets - 10 reps - Romberg Stance Eyes Closed on Foam Pad  - 1 x daily - 7 x weekly - 1 sets - 3 reps - 30 hold - Single Leg Balance  - 1 x daily - 7 x weekly - 1 sets - 3 reps - 30 hold - Lower Quarter Reach Combination  - 1 x daily - 7 x weekly - 2 sets - 10 reps    Exercises     Assessment/Plan    PT Assessment Patient does not need any further PT services  PT Problem List         PT Treatment Interventions      PT Goals (Current  goals can be found in the Care Plan section)  Acute Rehab PT Goals Patient Stated Goal: return to normal activities PT Goal Formulation: All assessment and education complete, DC therapy    Frequency       Co-evaluation               AM-PAC PT 6 Clicks Mobility  Outcome Measure Help needed turning from your back to your side while in a flat bed without using bedrails?: None Help needed moving from lying on your back to sitting on the side of a flat bed without using bedrails?: None Help needed moving to and from a bed to a chair (including a wheelchair)?: None Help needed standing up from a chair using your arms (e.g., wheelchair or bedside chair)?: None Help needed to walk in hospital room?: None Help needed climbing 3-5 steps with a railing? : A Little 6 Click Score: 23    End of Session Equipment Utilized During Treatment: Gait belt Activity Tolerance: Patient tolerated treatment well Patient left: in bed;with call bell/phone within reach Nurse Communication: Mobility status PT Visit Diagnosis: Other abnormalities of gait and mobility (R26.89);Muscle weakness (generalized) (M62.81)    Time: 1640-1710 PT Time Calculation (min) (ACUTE ONLY): 30 min   Charges:   PT Evaluation $PT Eval Low Complexity: 1 Low PT Treatments $Neuromuscular Re-education: 8-22 mins PT General Charges $$ ACUTE PT VISIT: 1 Visit         Benjiman, PT Acute Rehab [facility] [phone]   Benjiman VEAR Mulberry 09/09/2024, 5:51 PM

## 2024-09-10 ENCOUNTER — Other Ambulatory Visit (HOSPITAL_COMMUNITY): Payer: Self-pay

## 2024-09-10 ENCOUNTER — Other Ambulatory Visit: Payer: Self-pay | Admitting: Physician Assistant

## 2024-09-10 ENCOUNTER — Telehealth (HOSPITAL_COMMUNITY): Payer: Self-pay | Admitting: Pharmacy Technician

## 2024-09-10 DIAGNOSIS — I639 Cerebral infarction, unspecified: Secondary | ICD-10-CM

## 2024-09-10 DIAGNOSIS — I2699 Other pulmonary embolism without acute cor pulmonale: Secondary | ICD-10-CM | POA: Diagnosis not present

## 2024-09-10 LAB — CBC
HCT: 41.8 % (ref 36.0–46.0)
Hemoglobin: 13.3 g/dL (ref 12.0–15.0)
MCH: 30.6 pg (ref 26.0–34.0)
MCHC: 31.8 g/dL (ref 30.0–36.0)
MCV: 96.3 fL (ref 80.0–100.0)
Platelets: 222 K/uL (ref 150–400)
RBC: 4.34 MIL/uL (ref 3.87–5.11)
RDW: 13.2 % (ref 11.5–15.5)
WBC: 5.1 K/uL (ref 4.0–10.5)
nRBC: 0 % (ref 0.0–0.2)

## 2024-09-10 LAB — HEPARIN LEVEL (UNFRACTIONATED): Heparin Unfractionated: 0.44 [IU]/mL (ref 0.30–0.70)

## 2024-09-10 MED ORDER — APIXABAN 5 MG PO TABS
5.0000 mg | ORAL_TABLET | Freq: Two times a day (BID) | ORAL | 2 refills | Status: AC
  Filled 2024-09-10: qty 60, 30d supply, fill #0

## 2024-09-10 MED ORDER — APIXABAN 5 MG PO TABS
10.0000 mg | ORAL_TABLET | Freq: Two times a day (BID) | ORAL | Status: DC
Start: 2024-09-10 — End: 2024-09-10

## 2024-09-10 MED ORDER — ATORVASTATIN CALCIUM 40 MG PO TABS
40.0000 mg | ORAL_TABLET | Freq: Every day | ORAL | 0 refills | Status: AC
  Filled 2024-09-10: qty 90, 90d supply, fill #0

## 2024-09-10 MED ORDER — APIXABAN 5 MG PO TABS: 5.0000 mg | ORAL_TABLET | Freq: Two times a day (BID) | ORAL | Status: DC

## 2024-09-10 MED ORDER — APIXABAN 5 MG PO TABS
5.0000 mg | ORAL_TABLET | Freq: Two times a day (BID) | ORAL | Status: DC
  Administered 2024-09-10: 5 mg via ORAL
  Filled 2024-09-10: qty 1

## 2024-09-10 NOTE — TOC Transition Note (Signed)
 Transition of Care [facility]) - Discharge Note   Patient Details  Name: Crystal Alvarado MRN: [id number] Date of Birth: 03-11-1951  Transition of Care [facility]) CM/SW Contact:  Bascom Service, RN Phone Number: 09/10/2024, 10:46 AM   Clinical Narrative: d/c home No CM needs.      Final next level of care: Home/Self Care Barriers to Discharge: No Barriers Identified   Patient Goals and CMS Choice Patient states their goals for this hospitalization and ongoing recovery are:: Home CMS Medicare.gov Compare Post Acute Care list provided to:: Patient Choice offered to / list presented to : Patient [place] ownership interest in [facility].provided to:: Patient    Discharge Placement                       Discharge Plan and Services Additional resources added to the After Visit Summary for     Discharge Planning Services: CM Consult                                 Social Drivers of Health (SDOH) Interventions SDOH Screenings   Food Insecurity: No Food Insecurity (09/09/2024)  Housing: Low Risk  (09/09/2024)  Transportation Needs: No Transportation Needs (09/09/2024)  Utilities: Not At Risk (09/09/2024)  Social Connections: Patient Declined (09/09/2024)  Tobacco Use: Low Risk  (09/09/2024)     Readmission Risk Interventions     No data to display

## 2024-09-10 NOTE — Discharge Instructions (Signed)
 Information on my medicine - ELIQUIS  (apixaban )   Why was Eliquis  prescribed for you? Eliquis  was prescribed to treat blood clots that may have been found in the veins of your legs (deep vein thrombosis) or in your lungs (pulmonary embolism) and to reduce the risk of them occurring again.  What do You need to know about Eliquis  ? The dose is 5 mg (one tablet) taken TWICE daily.  Eliquis  may be taken with or without food.   Try to take the dose about the same time in the morning and in the evening. If you have difficulty swallowing the tablet whole please discuss with your pharmacist how to take the medication safely.  Take Eliquis  exactly as prescribed and DO NOT stop taking Eliquis  without talking to the doctor who prescribed the medication.  Stopping may increase your risk of developing a new blood clot.  Refill your prescription before you run out.  After discharge, you should have regular check-up appointments with your healthcare provider that is prescribing your Eliquis .    What do you do if you miss a dose? If a dose of ELIQUIS  is not taken at the scheduled time, take it as soon as possible on the same day and twice-daily administration should be resumed. The dose should not be doubled to make up for a missed dose.  Important Safety Information A possible side effect of Eliquis  is bleeding. You should call your healthcare provider right away if you experience any of the following: Bleeding from an injury or your nose that does not stop. Unusual colored urine (red or dark brown) or unusual colored stools (red or black). Unusual bruising for unknown reasons. A serious fall or if you hit your head (even if there is no bleeding).  Some medicines may interact with Eliquis  and might increase your risk of bleeding or clotting while on Eliquis . To help avoid this, consult your healthcare provider or pharmacist prior to using any new prescription or non-prescription medications,  including herbals, vitamins, non-steroidal anti-inflammatory drugs (NSAIDs) and supplements.  This website has more information on Eliquis  (apixaban ): [email or web address]

## 2024-09-10 NOTE — Progress Notes (Addendum)
 PHARMACY - ANTICOAGULATION CONSULT NOTE  Pharmacy Consult for heparin  Indication: pulmonary embolus  No Known Allergies  Patient Measurements: Height: 4' 11 (149.9 cm) Weight: 51.7 kg (114 lb) IBW/kg (Calculated) : 43.2 HEPARIN  DW (KG): 51.7  Vital Signs: Temp: 97.9 F (36.6 C) (09/23 0857) Temp Source: Oral (09/23 0857) BP: 132/77 (09/23 0857) Pulse Rate: 75 (09/23 0857)  Labs: Recent Labs    09/08/24 2104 09/08/24 2305 09/09/24 0705 09/09/24 1633 09/10/24 0055  HGB 14.4  --  13.8  --  13.3  HCT 44.6  --  41.7  --  41.8  PLT 236  --  218  --  222  APTT  --  26  --   --   --   HEPARINUNFRC  --   --  0.78* 0.40 0.44  CREATININE 0.69  --  0.64  --   --     Estimated Creatinine Clearance: 43.4 mL/min (by C-G formula based on SCr of 0.64 mg/dL).   Medical History: Past Medical History:  Diagnosis Date   Breast cancer [facility])    Depression      Assessment: 73 yo female with history of breast cancer presented to ED for dizziness and per advice of medical professional for workup for PE. Patient reports recent travel (flight + cruise). During cruise, she was evaluated in the ED during stop in [place] where she had a CT head negative for stroke/bleeding per patient. She continued to have symptoms of vertigo. She had messages noting concern for blood clots in the top of her lungs and advised to go immediately to the ED.  Imaging here positive for segmental right middle and lower lobe PE, possibly acute, although subacute/chronic emboli could have this appearance. No evidence of right heart strain.  Patient was started on heparin  infusion with pharmacy consulted for dosing.  Today, 9/23, pharmacy has been consulted for transition to Eliquis .  Goal of Therapy:  Heparin  level 0.3-0.7 units/ml Monitor platelets by anticoagulation protocol: Yes   Plan:  -Stop heparin  infusion, ideally with first dose of Eliquis  -Per discussion with Neurology and hospitalist, will  defer initial loading dose of Eliquis  given risk of hemorrhagic conversion in the first few weeks following stroke -Start Eliquis  5 mg BID -Pharmacy to provide education and coupon to patient prior to discharge   Stefano MARLA Bologna, PharmD, BCPS Clinical Pharmacist 09/10/2024 9:41 AM

## 2024-09-10 NOTE — Progress Notes (Signed)
 Discharge meds in a secure bag delivered to pt in room by this RN

## 2024-09-10 NOTE — Progress Notes (Signed)
 PHARMACY - ANTICOAGULATION CONSULT NOTE  Pharmacy Consult for heparin  Indication: pulmonary embolus  No Known Allergies  Patient Measurements: Height: 4' 11 (149.9 cm) Weight: 51.7 kg (114 lb) IBW/kg (Calculated) : 43.2 HEPARIN  DW (KG): 51.7  Vital Signs: Temp: 98 F (36.7 C) (09/22 2058) Temp Source: Oral (09/22 7941) BP: 134/73 (09/22 2058) Pulse Rate: 76 (09/22 2058)  Labs: Recent Labs    09/08/24 2104 09/08/24 2305 09/09/24 0705 09/09/24 1633 09/10/24 0055  HGB 14.4  --  13.8  --  13.3  HCT 44.6  --  41.7  --  41.8  PLT 236  --  218  --  222  APTT  --  26  --   --   --   HEPARINUNFRC  --   --  0.78* 0.40 0.44  CREATININE 0.69  --  0.64  --   --     Estimated Creatinine Clearance: 43.4 mL/min (by C-G formula based on SCr of 0.64 mg/dL).   Medical History: Past Medical History:  Diagnosis Date   Breast cancer [facility])    Depression      Assessment: 73 yo female with history of breast cancer presented to ED for dizziness and per advice of medical professional for workup for PE. Patient reports recent travel (flight + cruise). During cruise, she was evaluated in the ED during stop in [place] where she had a CT head negative for stroke/bleeding per patient. She continued to have symptoms of vertigo. She had messages noting concern for blood clots in the top of her lungs and advised to go immediately to the ED.  Imaging here positive for segmental right middle and lower lobe PE, possibly acute, although subacute/chronic emboli could have this appearance. No evidence of right heart strain.  Patient was started on heparin  infusion with pharmacy consulted for dosing.  09/10/2024: -Heparin  level 0.44-remains therapeutic on IV heparin  650 units/hr -CBC WNL/stable -No bleeding or infusion issues reported by RN  Goal of Therapy:  Heparin  level 0.3-0.7 units/ml Monitor platelets by anticoagulation protocol: Yes   Plan:  -Continue heparin  infusion to 650  units/hr -Daily CBC & heparin  level -Continue to follow for plan of care   Rosaline Millet, PharmD, BCPS Clinical Pharmacist 09/10/2024 3:29 AM
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
## 2024-09-10 NOTE — Progress Notes (Signed)
 Ordered 30 day monitor for stroke  Dr. Renell to read

## 2024-09-10 NOTE — Telephone Encounter (Signed)
 Patient Product/process development scientist completed.    The patient is insured through [employer]. Patient has Medicare and is not eligible for a copay card, but may be able to apply for patient assistance or Medicare RX Payment Plan (Patient Must reach out to their plan, if eligible for payment plan), if available.    Ran test claim for Eliquis  5 mg and the current 30 day co-pay is $247.98 due to a deductible.  Ran test claim for Xarelto 20 mg and the current 30 day co-pay is $246.54 due to a deductible.  This test claim was processed through [facility]- copay amounts may vary at other pharmacies due to pharmacy/plan contracts, or as the patient moves through the different stages of their insurance plan.     Crystal Alvarado, CPHT Pharmacy Technician III Certified Patient Advocate [facility] Patient Advocate Team Direct Number: [phone]  Fax: [phone]

## 2024-10-08 ENCOUNTER — Other Ambulatory Visit (HOSPITAL_COMMUNITY): Payer: Self-pay

## 2024-10-15 ENCOUNTER — Ambulatory Visit: Attending: Physician Assistant

## 2024-10-15 DIAGNOSIS — I639 Cerebral infarction, unspecified: Secondary | ICD-10-CM

## 2024-10-17 ENCOUNTER — Ambulatory Visit: Payer: Self-pay | Admitting: Physician Assistant

## 2024-10-17 DIAGNOSIS — I639 Cerebral infarction, unspecified: Secondary | ICD-10-CM

## 2024-11-04 ENCOUNTER — Ambulatory Visit: Payer: PRIVATE HEALTH INSURANCE | Attending: Internal Medicine | Admitting: Internal Medicine

## 2024-11-04 VITALS — BP 122/82 | HR 82 | Ht 58.5 in | Wt 115.8 lb

## 2024-11-04 DIAGNOSIS — R072 Precordial pain: Secondary | ICD-10-CM | POA: Diagnosis not present

## 2024-11-04 DIAGNOSIS — R0789 Other chest pain: Secondary | ICD-10-CM | POA: Diagnosis present

## 2024-11-04 DIAGNOSIS — E7849 Other hyperlipidemia: Secondary | ICD-10-CM | POA: Diagnosis present

## 2024-11-04 MED ORDER — METOPROLOL TARTRATE 100 MG PO TABS: ORAL_TABLET | ORAL | 0 refills | Status: DC

## 2024-11-04 NOTE — Progress Notes (Signed)
 Cardiology Office Note:  .    Date:  11/04/2024  ID:  Nena LITTIE Opal, DOB Jan 31, 1951, MRN [id number] PCP: Loring Tanda Mae, MD  [facility] Providers Cardiologist:  None     CC: Post stroke evaluation Consulted for the evaluation of PE and RV care at the behest of Dr. Terence   History of Present Illness: .    Crystal Alvarado is a 73 y.o. 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 She is accompanied by her husband, Crystal Alvarado. She was referred by her neurologist for evaluation of a potential patent foramen ovale (PFO) and follow-up after a heart monitor.  She experienced a segmental pulmonary embolism and deep vein thrombosis (DVT) while on a cruise, followed by a potential thromboembolic stroke or transient ischemic attack. During her hospitalization, an echocardiogram revealed a small amount of fluid around her heart, and a heart monitor was placed.  She feels well overall but notes limitations in her physical activity. She experiences significant fatigue when lifting objects above her head, stating 'it really wipes me out.' Initially, her condition improved after hospitalization, but symptoms recurred after lifting a mattress. She describes a gradual improvement since then.  No chest pain, but she reports occasional exertional chest pressure without a specific pattern. The pressure tends to occur during physical activity and resolves with rest. No breathing difficulties or inability to catch her breath.  She has been on atorvastatin  for high cholesterol, which was a new medication for her. She also mentions having leg cramps at night and a sensation of heaviness in her legs, which resolved after her cruise, suggesting a possible link to her DVT.  Her neurologist raised concerns about a potential patent foramen ovale (PFO) as a cause for her stroke, prompting the referral for further evaluation.  Discussed the use of AI  scribe software for clinical note transcription with the patient, who gave verbal consent to proceed.   Relevant histories: .  Social  - comes with husband, from [place] originally ROS: As per HPI.   Studies Reviewed: .     Cardiac Studies & Procedures   ______________________________________________________________________________________________     ECHOCARDIOGRAM  ECHOCARDIOGRAM COMPLETE 09/09/2024  Narrative ECHOCARDIOGRAM REPORT    Patient Name:   Crystal Alvarado Date of Exam: 09/09/2024 Medical Rec #:  [id number]        Height:       59.0 in Accession #:    [id number]       Weight:       114.0 lb Date of Birth:  02-27-1951         BSA:          1.452 m Patient Age:    73 years         BP:           114/62 mmHg Patient Gender: F                HR:           87 bpm. Exam Location:  Inpatient  Procedure: 2D Echo, Cardiac Doppler and Color Doppler (Both Spectral and Color Flow Doppler were utilized during procedure).  Indications:    Pulmonary Embolus I26.09  History:        Patient has no prior history of Echocardiogram examinations. Hx of cancer; Signs/Symptoms:Pulmonary Embolus.  Sonographer:    Koleen Popper RDCS Referring Phys: [id number]ERNARD[address] KATHEE COME  IMPRESSIONS   1. Left  ventricular ejection fraction, by estimation, is 65 to 70%. The left ventricle has normal function. The left ventricle has no regional wall motion abnormalities. Left ventricular diastolic parameters are consistent with Grade I diastolic dysfunction (impaired relaxation). 2. Right ventricular systolic function is normal. The right ventricular size is normal. There is normal pulmonary artery systolic pressure. The estimated right ventricular systolic pressure is 34.8 mmHg. 3. The mitral valve is normal in structure. Trivial mitral valve regurgitation. No evidence of mitral stenosis. 4. The aortic valve is tricuspid. Aortic valve regurgitation is not visualized. No aortic stenosis is present. 5. The  inferior vena cava is normal in size with greater than 50% respiratory variability, suggesting right atrial pressure of 3 mmHg. 6. A small pericardial effusion is present.  FINDINGS Left Ventricle: Left ventricular ejection fraction, by estimation, is 65 to 70%. The left ventricle has normal function. The left ventricle has no regional wall motion abnormalities. The left ventricular internal cavity size was normal in size. There is no left ventricular hypertrophy. Left ventricular diastolic parameters are consistent with Grade I diastolic dysfunction (impaired relaxation).  Right Ventricle: The right ventricular size is normal. No increase in right ventricular wall thickness. Right ventricular systolic function is normal. There is normal pulmonary artery systolic pressure. The tricuspid regurgitant velocity is 2.82 m/s, and with an assumed right atrial pressure of 3 mmHg, the estimated right ventricular systolic pressure is 34.8 mmHg.  Left Atrium: Left atrial size was normal in size.  Right Atrium: Right atrial size was normal in size.  Pericardium: A small pericardial effusion is present.  Mitral Valve: The mitral valve is normal in structure. Trivial mitral valve regurgitation. No evidence of mitral valve stenosis.  Tricuspid Valve: The tricuspid valve is normal in structure. Tricuspid valve regurgitation is mild.  Aortic Valve: The aortic valve is tricuspid. Aortic valve regurgitation is not visualized. No aortic stenosis is present.  Pulmonic Valve: The pulmonic valve was normal in structure. Pulmonic valve regurgitation is not visualized.  Aorta: The aortic root is normal in size and structure.  Venous: The inferior vena cava is normal in size with greater than 50% respiratory variability, suggesting right atrial pressure of 3 mmHg.  IAS/Shunts: No atrial level shunt detected by color flow Doppler.   LEFT VENTRICLE PLAX 2D LVIDd:         3.80 cm     Diastology LVIDs:          2.60 cm     LV e' medial:    4.79 cm/s LV PW:         0.70 cm     LV E/e' medial:  13.0 LV IVS:        0.80 cm     LV e' lateral:   7.62 cm/s LVOT diam:     1.80 cm     LV E/e' lateral: 8.1 LVOT Area:     2.54 cm  LV Volumes (MOD) LV vol d, MOD A2C: 59.4 ml LV vol s, MOD A2C: 29.1 ml LV SV MOD A2C:     30.3 ml  RIGHT VENTRICLE             IVC RV S prime:     13.60 cm/s  IVC diam: 1.80 cm TAPSE (M-mode): 2.2 cm  LEFT ATRIUM           Index        RIGHT ATRIUM           Index LA diam:  2.90 cm 2.00 cm/m   RA Area:     12.20 cm LA Vol (A4C): 21.3 ml 14.67 ml/m  RA Volume:   28.00 ml  19.28 ml/m  AORTA Ao Root diam: 2.60 cm Ao Asc diam:  2.80 cm  MITRAL VALVE               TRICUSPID VALVE MV Area (PHT): 3.60 cm    TR Peak grad:   31.8 mmHg MV Decel Time: 211 msec    TR Vmax:        282.00 cm/s MV E velocity: 62.10 cm/s MV A velocity: 89.50 cm/s  SHUNTS MV E/A ratio:  0.69        Systemic Diam: 1.80 cm  Dalton McleanMD Electronically signed by Ezra Kanner Signature Date/Time: 09/09/2024/9:08:57 PM    Final    MONITORS  CARDIAC EVENT MONITOR 10/15/2024  Narrative   Patient had a minimum heart rate of 54 bpm, maximum heart rate of 128 bpm, and average heart rate of 74 bpm. Predominant underlying rhythm was sinus. No evidence of significant heart block . Triggered and diary events associated with sinus rhythm.  No malignant arrhythmias.       ______________________________________________________________________________________________      Physical Exam:    VS:  BP 122/82 (BP Location: Right Arm, Patient Position: Sitting)   Pulse 82   Ht 4' 10.5 (1.486 m)   Wt 115 lb 12.8 oz (52.5 kg)   SpO2 97%   BMI 23.79 kg/m    Wt Readings from Last 3 Encounters:  11/04/24 115 lb 12.8 oz (52.5 kg)  09/08/24 114 lb (51.7 kg)    Gen: no distress Cardiac: No Rubs or Gallops, no Murmur, RRR +2 radial pulses Respiratory: Clear to auscultation bilaterally,  normal effort, normal  respiratory rate GI: Soft, nontender, non-distended  MS: No  edema;  moves all extremities Integument: Skin feels warm Neuro:  At time of evaluation, alert and oriented to person/place/time/situation  Psych: Normal affect, patient feels ok      ASSESSMENT AND PLAN: .    History of stroke secondary to segmental pulmonary embolism and deep vein thrombosis - Neurology had questions of stroke secondary to segmental pulmonary embolism and deep vein thrombosis. Echocardiogram showed pericardial effusion, common post-pulmonary embolism. Heart monitor results had no evidence of AF or AFL. Discussion on potential patent foramen ovale (PFO) as a contributing factor to stroke. Most PFOs are benign, but closure may be considered if significant risk factors are present. Closure procedure involves catheterization and is minimally invasive with low risk. - Ordered gated cardiac CT scan to evaluate for patent foramen ovale and coronary artery disease (needs systolic diastolic imaging) - Will discuss potential closure of PFO if significant findings are present (f/u with structural) and would get a bubble study - AC managed by PCP; she had questions about cost of non-cardiac AC therapy; will send courtesy message to PCP  Suspected patent foramen ovale - Suspected PFO as a potential contributor to stroke. Most PFOs are benign, but closure may be considered if significant risk factors are present. Closure procedure involves catheterization   Exertional chest pressure Intermittent exertional chest pressure, resolving with rest. No associated chest pain. Differential includes cardiac causes, potentially related to coronary artery disease. - Ordered gated cardiac CT scan to evaluate for coronary artery disease. - Will reassess if significant blockages are found.  Familial hypercholesterolemia Managed with atorvastatin . Cholesterol levels are a risk factor for stroke. Aggressive prevention  strategies are in place. -  Checked LDL direct and ALT levels to assess response to atorvastatin  therapy. - Continue atorvastatin  therapy for cholesterol management.; LDL < 70  Six months with me or my team unless obstructive disease or PFO closure needed  Stanly Leavens, MD FASE [facility] Cardiologist [facility]  [address], [address] [place], [place] [address] [phone]  11:23 AM

## 2024-11-04 NOTE — Patient Instructions (Addendum)
 Medication Instructions:  Your physician recommends that you continue on your current medications as directed. Please refer to the Current Medication list given to you today.  *If you need a refill on your cardiac medications before your next appointment, please call your pharmacy*  Lab Work: LDL direct, ALT If you have labs (blood work) drawn today and your tests are completely normal, you will receive your results only by: MyChart Message (if you have MyChart) OR A paper copy in the mail If you have any lab test that is abnormal or we need to change your treatment, we will call you to review the results.  Testing/Procedures:   Your cardiac CT will be scheduled at one of the below locations:   [facility] [address]  [place], [place] [address]  If scheduled at the [facility] and V[facility] at [address], please enter the parking lot using the [address] entrance and use the FREE valet service at the patient drop-off area. Enter the building and check-in with registration on the main floor.  Please follow these instructions carefully (unless otherwise directed):  An IV will be required for this test and Nitroglycerin will be given.   On the Night Before the Test: Be sure to Drink plenty of water. Do not consume any caffeinated/decaffeinated beverages or chocolate 12 hours prior to your test. Do not take any antihistamines 12 hours prior to your test.  On the Day of the Test: Drink plenty of water until 1 hour prior to the test. Do not eat any food 1 hour prior to test. You may take your regular medications prior to the test.  Take metoprolol (Lopressor) two hours prior to test. If you take Furosemide/Hydrochlorothiazide/Spironolactone/Chlorthalidone, please HOLD on the morning of the test. Patients who wear a continuous glucose monitor MUST remove the device prior to scanning. FEMALES- please wear underwire-free bra if available, avoid  dresses & tight clothing       After the Test: Drink plenty of water. After receiving IV contrast, you may experience a mild flushed feeling. This is normal. On occasion, you may experience a mild rash up to 24 hours after the test. This is not dangerous. If this occurs, you can take Benadryl 25 mg, Zyrtec, Claritin, or Allegra and increase your fluid intake. (Patients taking Tikosyn should avoid Benadryl, and may take Zyrtec, Claritin, or Allegra) If you experience trouble breathing, this can be serious. If it is severe call 911 IMMEDIATELY. If it is mild, please call our office.  We will call to schedule your test 2-4 weeks out understanding that some insurance companies will need an authorization prior to the service being performed.   For more information and frequently asked questions, please visit our website : [email or web address]  For non-scheduling related questions, please contact the cardiac imaging nurse navigator should you have any questions/concerns: Cardiac Imaging Nurse Navigators Direct Office Dial: [phone]   For scheduling needs, including cancellations and rescheduling, please call Brittany, [phone].   Follow-Up: At [facility], you and your health needs are our priority.  As part of our continuing mission to provide you with exceptional heart care, our providers are all part of one team.  This team includes your primary Cardiologist (physician) and Advanced Practice Providers or APPs (Physician Assistants and Nurse Practitioners) who all work together to provide you with the care you need, when you need it.  Your next appointment:   6 month(s)  Provider:   One of our  Advanced Practice Providers (APPs): Morse Clause, PA-C  Lamarr Satterfield, NP Miriam Shams, NP  Olivia Pavy, PA-C Josefa Beauvais, NP  Leontine Salen, PA-C Orren Fabry, PA-C  Hao Meng, PA-C Ernest Dick, NP  Damien Braver, NP Jon Hails, PA-C  Waddell Donath,  PA-C    Dayna Dunn, PA-C  Scott Weaver, PA-C Lum Louis, NP Katlyn West, NP Callie Goodrich, PA-C  Xika Zhao, NP Sheng Haley, PA-C    Kathleen Johnson, PA-C   Or Dr. Santo

## 2024-11-05 LAB — ALT: ALT: 41 IU/L — ABNORMAL HIGH (ref 0–32)

## 2024-11-05 LAB — LDL CHOLESTEROL, DIRECT: LDL Direct: 83 mg/dL (ref 0–99)

## 2024-11-06 ENCOUNTER — Ambulatory Visit: Payer: Self-pay | Admitting: Internal Medicine

## 2024-11-12 ENCOUNTER — Encounter (HOSPITAL_COMMUNITY): Payer: Self-pay

## 2024-11-18 ENCOUNTER — Ambulatory Visit (HOSPITAL_COMMUNITY)
Admission: RE | Admit: 2024-11-18 | Discharge: 2024-11-18 | Disposition: A | Discharge status: Home / Self Care | Source: Ambulatory Visit | Attending: Internal Medicine | Admitting: Internal Medicine

## 2024-11-18 DIAGNOSIS — R072 Precordial pain: Secondary | ICD-10-CM | POA: Insufficient documentation

## 2024-11-18 DIAGNOSIS — R0789 Other chest pain: Secondary | ICD-10-CM | POA: Diagnosis present

## 2024-11-18 MED ORDER — NITROGLYCERIN 0.4 MG SL SUBL
0.8000 mg | SUBLINGUAL_TABLET | Freq: Once | SUBLINGUAL | Status: AC
  Administered 2024-11-18: 0.8 mg via SUBLINGUAL

## 2024-11-18 MED ORDER — IOHEXOL 350 MG/ML SOLN
100.0000 mL | Freq: Once | INTRAVENOUS | Status: AC | PRN
  Administered 2024-11-18: 100 mL via INTRAVENOUS

## 2024-12-18 ENCOUNTER — Other Ambulatory Visit (HOSPITAL_COMMUNITY): Payer: Self-pay

## 2024-12-31 ENCOUNTER — Ambulatory Visit: Admitting: Diagnostic Neuroimaging

## 2024-12-31 ENCOUNTER — Encounter: Payer: Self-pay | Admitting: Diagnostic Neuroimaging

## 2024-12-31 VITALS — BP 122/70 | HR 76 | Ht 58.5 in | Wt 119.6 lb

## 2024-12-31 DIAGNOSIS — I2782 Chronic pulmonary embolism: Secondary | ICD-10-CM | POA: Diagnosis not present

## 2024-12-31 DIAGNOSIS — E7849 Other hyperlipidemia: Secondary | ICD-10-CM | POA: Diagnosis not present

## 2024-12-31 DIAGNOSIS — I63442 Cerebral infarction due to embolism of left cerebellar artery: Secondary | ICD-10-CM | POA: Diagnosis not present

## 2024-12-31 NOTE — Patient Instructions (Addendum)
" °  LEFT CEREBELLAR INFARCTION (possibly due to small vessel disease / hyperlipidemia vs paradoxical embolism) - continue eliquis  for PE (duration per PCP; unclear cause except for travel, but did not have prolonged flight or car ride; family history of blood clots in mother; consider hypercoag workup) - if anticoagulation (eliquis ) is stopped in future, then start aspirin 81mg  daily - continue atorvastatin  40mg  daily - small PFO noted; recommend medical mgmt for now "

## 2024-12-31 NOTE — Progress Notes (Signed)
 "  [facility]  PATIENT: Crystal Alvarado DOB: 02/20/1951  REFERRING CLINICIAN: Raenelle Coria, MD HISTORY FROM: patient and husband REASON FOR VISIT: new consult   HISTORICAL  CHIEF COMPLAINT:  Chief Complaint  Patient presents with   RM 7     Patient is here with husband for CVA hospital follow-up - Had a stroke September 16th and would like to know if she may have another one in the future. Has blood clots in her lungs and has difficulty lifting things     HISTORY OF PRESENT ILLNESS:   74 year old female here for evaluation of possible stroke follow-up.  08/29/2024 patient flew to [place] and boarded a cruise to [place].  On 09/03/2024 while on the cruise she had sudden onset of visual disturbance where her vision tilted and she had severe nausea and vomiting.  She was seen by the hospital medical doctor and the next day she was able to go to emergency room in [place].  She had CT scan testing and was diagnosed with a possible inner ear cause of symptoms.  She got back on the boat and then ended up completing the trip coming home.  Upon arrival to [place]  she received a call from the [facility] who detected possibility of pulmonary embolism seen peripherally on the CT scan and she was told to go to the emergency room.  Patient went to [facility] emergency room on 09/08/2024.  Pulmonary embolism was confirmed.  She started anticoagulation.  She also had MRI of the brain which showed an acute to subacute left cerebellar infarction and chronic right cerebellar infarctions.  Since that time symptoms have resolved.  She feels well.  She is tolerating anticoagulation.  She is also started statin for hyperlipidemia.    REVIEW OF SYSTEMS: Full 14 system review of systems performed and negative with exception of: as per HPI.  ALLERGIES: Allergies[1]  HOME MEDICATIONS: Outpatient Medications Prior to Visit  Medication Sig Dispense Refill    apixaban  (ELIQUIS ) 5 MG TABS tablet Take 1 tablet (5 mg total) by mouth 2 (two) times daily. 60 tablet 2   atorvastatin  (LIPITOR) 40 MG tablet Take 1 tablet (40 mg total) by mouth daily. 90 tablet 0   escitalopram  (LEXAPRO ) 10 MG tablet Take 10 mg by mouth daily.     Multiple Minerals-Vitamins (CALCIUM  CITRATE PLUS/MAGNESIUM) TABS Take 1 tablet by mouth daily.     metoprolol  tartrate (LOPRESSOR ) 100 MG tablet Take 2 hours prior to CT (Patient not taking: Reported on 12/31/2024) 1 tablet 0   No facility-administered medications prior to visit.    PAST MEDICAL HISTORY: Past Medical History:  Diagnosis Date   Breast cancer (HCC)    Depression     PAST SURGICAL HISTORY: Past Surgical History:  Procedure Laterality Date   CESAREAN SECTION     MASTECTOMY      FAMILY HISTORY: Family History  Problem Relation Age of Onset   Migraines Neg Hx    Seizures Neg Hx    Stroke Neg Hx    Sleep apnea Neg Hx     SOCIAL HISTORY: Social History   Socioeconomic History   Marital status: Married    Spouse name: Not on file   Number of children: Not on file   Years of education: Not on file   Highest education level: Not on file  Occupational History   Not on file  Tobacco Use   Smoking status: Never   Smokeless tobacco: Never  Substance and Sexual Activity   Alcohol use: Not on file   Drug use: Not on file   Sexual activity: Not on file  Other Topics Concern   Not on file  Social History Narrative   Some tea daily ( green tea unsweet)    Social Drivers of Health   Tobacco Use: Low Risk (10/31/2024)   Patient History    Smoking Tobacco Use: Never    Smokeless Tobacco Use: Never    Passive Exposure: Not on file  Financial Resource Strain: Not on file  Food Insecurity: No Food Insecurity (09/09/2024)   Epic    Worried About Programme Researcher, Broadcasting/film/video in the Last Year: Never true    Ran Out of Food in the Last Year: Never true  Transportation Needs: No Transportation Needs (09/09/2024)    Epic    Lack of Transportation (Medical): No    Lack of Transportation (Non-Medical): No  Physical Activity: Not on file  Stress: Not on file  Social Connections: Patient Declined (09/09/2024)   Social Connection and Isolation Panel    Frequency of Communication with Friends and Family: Patient declined    Frequency of Social Gatherings with Friends and Family: Patient declined    Attends Religious Services: Patient declined    Database Administrator or Organizations: Patient declined    Attends Banker Meetings: Patient declined    Marital Status: Patient declined  Intimate Partner Violence: Not At Risk (09/09/2024)   Epic    Fear of Current or Ex-Partner: No    Emotionally Abused: No    Physically Abused: No    Sexually Abused: No  Depression (PHQ2-9): Not on file  Alcohol Screen: Not on file  Housing: Low Risk (09/09/2024)   Epic    Unable to Pay for Housing in the Last Year: No    Number of Times Moved in the Last Year: 0    Homeless in the Last Year: No  Utilities: Not At Risk (09/09/2024)   Epic    Threatened with loss of utilities: No  Health Literacy: Not on file     PHYSICAL EXAM  GENERAL EXAM/CONSTITUTIONAL: Vitals:  Vitals:   12/31/24 1006  BP: 122/70  Pulse: 76  Weight: 119 lb 9.6 oz (54.3 kg)  Height: 4' 10.5 (1.486 m)   Body mass index is 24.57 kg/m. Wt Readings from Last 3 Encounters:  12/31/24 119 lb 9.6 oz (54.3 kg)  11/04/24 115 lb 12.8 oz (52.5 kg)  09/08/24 114 lb (51.7 kg)   Patient is in no distress; well developed, nourished and groomed; neck is supple  CARDIOVASCULAR: Examination of carotid arteries is normal; no carotid bruits Regular rate and rhythm, no murmurs Examination of peripheral vascular system by observation and palpation is normal  EYES: Ophthalmoscopic exam of optic discs and posterior segments is normal; no papilledema or hemorrhages No results found.  MUSCULOSKELETAL: Gait, strength, tone, movements  noted in Neurologic exam below  NEUROLOGIC: MENTAL STATUS:      No data to display         awake, alert, oriented to person, place and time recent and remote memory intact normal attention and concentration language fluent, comprehension intact, naming intact fund of knowledge appropriate  CRANIAL NERVE:  2nd - no papilledema on fundoscopic exam 2nd, 3rd, 4th, 6th - pupils equal and reactive to light, visual fields full to confrontation, extraocular muscles intact, no nystagmus 5th - facial sensation symmetric 7th - facial strength symmetric 8th - hearing intact  9th - palate elevates symmetrically, uvula midline 11th - shoulder shrug symmetric 12th - tongue protrusion midline  MOTOR:  normal bulk and tone, full strength in the BUE, BLE  SENSORY:  normal and symmetric to light touch, temperature, vibration  COORDINATION:  finger-nose-finger, fine finger movements normal  REFLEXES:  deep tendon reflexes present and symmetric  GAIT/STATION:  narrow based gait     DIAGNOSTIC DATA (LABS, IMAGING, TESTING) - I reviewed patient records, labs, notes, testing and imaging myself where available.  Lab Results  Component Value Date   WBC 5.1 09/10/2024   HGB 13.3 09/10/2024   HCT 41.8 09/10/2024   MCV 96.3 09/10/2024   PLT 222 09/10/2024      Component Value Date/Time   NA 140 09/09/2024 0705   K 4.0 09/09/2024 0705   CL 106 09/09/2024 0705   CO2 23 09/09/2024 0705   GLUCOSE 95 09/09/2024 0705   BUN 19 09/09/2024 0705   CREATININE 0.64 09/09/2024 0705   CALCIUM  8.3 (L) 09/09/2024 0705   ALT 41 (H) 11/04/2024 1113   GFRNONAA >60 09/09/2024 0705   Lab Results  Component Value Date   CHOL 272 (H) 09/09/2024   HDL 53 09/09/2024   LDLCALC 197 (H) 09/09/2024   LDLDIRECT 83 11/04/2024   TRIG 109 09/09/2024   CHOLHDL 5.1 09/09/2024   Lab Results  Component Value Date   HGBA1C 5.0 09/09/2024   No results found for: VITAMINB12 No results found for:  TSH   09/09/24 BLE u/s RIGHT: - There is no evidence of deep vein thrombosis in the lower extremity.  - No cystic structure found in the popliteal fossa.    LEFT:  - There is no evidence of deep vein thrombosis in the lower extremity.  - No cystic structure found in the popliteal fossa.  - Popliteal and gastrocnemius veins are dilated with Rouleaux flow, but  compress easily   09/09/24 TTE 1. Left ventricular ejection fraction, by estimation, is 65 to 70%. The  left ventricle has normal function. The left ventricle has no regional  wall motion abnormalities. Left ventricular diastolic parameters are  consistent with Grade I diastolic  dysfunction (impaired relaxation).   2. Right ventricular systolic function is normal. The right ventricular  size is normal. There is normal pulmonary artery systolic pressure. The  estimated right ventricular systolic pressure is 34.8 mmHg.   3. The mitral valve is normal in structure. Trivial mitral valve  regurgitation. No evidence of mitral stenosis.   4. The aortic valve is tricuspid. Aortic valve regurgitation is not  visualized. No aortic stenosis is present.   5. The inferior vena cava is normal in size with greater than 50%  respiratory variability, suggesting right atrial pressure of 3 mmHg.   6. A small pericardial effusion is present.   09/09/24 CTA head / neck 1. The left posterior inferior cerebellar artery and the left anterior inferior cerebellar artery are not clearly demonstrated and are likely either occluded or diminutive/hypoplastic. There are collaterals from the superior cerebellar artery supplying the distribution of the posterior inferior and anterior inferior cerebellar arteries.  09/09/24 MRI brain [I reviewed images myself and agree with interpretation. -VRP]  1. Positive for a small 5 mm acute to subacute infarct in the Left PICA territory. There are similar chronic lacunar infarcts in the contralateral right  cerebellar hemisphere. But in the conjunction of recent PE this raises the possibility of paradoxical embolism. 2. No associated intracranial hemorrhage or mass effect. Mild for age  cerebral white matter signal changes also most commonly due to small vessel disease.   ASSESSMENT AND PLAN  74 y.o. year old female here with:  Dx:  1. 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 Other hyperlipidemia     PLAN:  LEFT CEREBELLAR INFARCTION (possibly due to small vessel disease / hyperlipidemia vs paradoxical embolism) - continue eliquis  for PE (duration per PCP; unclear cause except for travel, but did not have prolonged flight or car ride; family history of blood clots in mother; consider hypercoag workup) - if anticoagulation (eliquis ) is stopped in future, then start aspirin 81mg  daily - continue atorvastatin  40mg  daily - small PFO noted; recommend medical mgmt for now  Return for return to PCP, pending if symptoms worsen or fail to improve.  I reviewed images, labs, notes, records myself. I summarized findings and reviewed with patient, for this high risk condition (stroke) requiring high complexity decision making.    EDUARD FABIENE HANLON, MD 12/31/2024, 11:12 AM Certified in Neurology, Neurophysiology and Neuroimaging  [facility] [address] [place], [place] [address] [phone]     [1] No Known Allergies  "
